# Patient Record
Sex: Male | Born: 1995 | Race: White | Hispanic: No | Marital: Single | Smoking: Never smoker
Health system: Southern US, Community
[De-identification: ages and names within clinical notes are randomized; demographics above are authoritative.]

## PROBLEM LIST (undated history)

## (undated) DIAGNOSIS — K5100 Ulcerative (chronic) pancolitis without complications: Secondary | ICD-10-CM

## (undated) HISTORY — DX: Ulcerative (chronic) pancolitis without complications: K51.00

---
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
## 2013-08-09 ENCOUNTER — Other Ambulatory Visit: Payer: Self-pay | Admitting: Pediatrics

## 2013-08-09 DIAGNOSIS — K5100 Ulcerative (chronic) pancolitis without complications: Secondary | ICD-10-CM

## 2013-08-09 NOTE — Telephone Encounter (Signed)
Here's one 

## 2013-08-23 ENCOUNTER — Other Ambulatory Visit: Payer: Self-pay | Admitting: Pediatrics

## 2013-08-23 DIAGNOSIS — K5100 Ulcerative (chronic) pancolitis without complications: Secondary | ICD-10-CM

## 2013-08-23 MED ORDER — SULFASALAZINE 500 MG PO TBEC
1500.0000 mg | DELAYED_RELEASE_TABLET | Freq: Two times a day (BID) | ORAL | Status: AC
Start: 2013-08-23 — End: 2014-08-24

## 2014-11-07 ENCOUNTER — Other Ambulatory Visit: Payer: Self-pay | Admitting: Family Medicine

## 2014-11-07 DIAGNOSIS — E010 Iodine-deficiency related diffuse (endemic) goiter: Secondary | ICD-10-CM
